# Patient Record
Sex: Male | Born: 1965 | Race: Black or African American | Hispanic: No | Marital: Married | Smoking: Never smoker
Health system: Southern US, Community
[De-identification: ages and names within clinical notes are randomized; demographics above are authoritative.]

## PROBLEM LIST (undated history)

## (undated) DIAGNOSIS — I10 Essential (primary) hypertension: Secondary | ICD-10-CM

---

## 2005-10-12 ENCOUNTER — Emergency Department (HOSPITAL_COMMUNITY): Admission: EM | Admit: 2005-10-12 | Discharge: 2005-10-13 | Payer: Self-pay | Admitting: Emergency Medicine

## 2012-05-23 ENCOUNTER — Emergency Department (HOSPITAL_COMMUNITY): Payer: Worker's Compensation

## 2012-05-23 ENCOUNTER — Emergency Department (HOSPITAL_COMMUNITY)
Admission: EM | Admit: 2012-05-23 | Discharge: 2012-05-23 | Disposition: A | Discharge status: Home / Self Care | Payer: Worker's Compensation | Attending: Emergency Medicine | Admitting: Emergency Medicine

## 2012-05-23 ENCOUNTER — Encounter (HOSPITAL_COMMUNITY): Payer: Self-pay | Admitting: Emergency Medicine

## 2012-05-23 DIAGNOSIS — Y9389 Activity, other specified: Secondary | ICD-10-CM | POA: Insufficient documentation

## 2012-05-23 DIAGNOSIS — S0990XA Unspecified injury of head, initial encounter: Secondary | ICD-10-CM | POA: Insufficient documentation

## 2012-05-23 DIAGNOSIS — S139XXA Sprain of joints and ligaments of unspecified parts of neck, initial encounter: Secondary | ICD-10-CM | POA: Insufficient documentation

## 2012-05-23 DIAGNOSIS — I10 Essential (primary) hypertension: Secondary | ICD-10-CM | POA: Insufficient documentation

## 2012-05-23 DIAGNOSIS — Z79899 Other long term (current) drug therapy: Secondary | ICD-10-CM | POA: Insufficient documentation

## 2012-05-23 DIAGNOSIS — S161XXA Strain of muscle, fascia and tendon at neck level, initial encounter: Secondary | ICD-10-CM

## 2012-05-23 HISTORY — DX: Essential (primary) hypertension: I10

## 2012-05-23 MED ORDER — IBUPROFEN 800 MG PO TABS
800.0000 mg | ORAL_TABLET | Freq: Once | ORAL | Status: AC
  Administered 2012-05-23: 800 mg via ORAL
  Filled 2012-05-23: qty 1

## 2012-05-23 MED ORDER — IBUPROFEN 800 MG PO TABS: 800.0000 mg | ORAL_TABLET | Freq: Three times a day (TID) | ORAL | Status: DC

## 2012-05-23 MED ORDER — CYCLOBENZAPRINE HCL 10 MG PO TABS: 10.0000 mg | ORAL_TABLET | Freq: Three times a day (TID) | ORAL | Status: DC | PRN

## 2012-05-23 NOTE — ED Notes (Signed)
Back board removed, no complaints of back pain.

## 2012-05-23 NOTE — ED Notes (Signed)
Pt discharged to home with family. NAD.  

## 2012-05-23 NOTE — ED Notes (Signed)
Pt to xray
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
## 2013-04-08 ENCOUNTER — Encounter: Payer: Self-pay | Admitting: Neurology

## 2013-04-09 ENCOUNTER — Ambulatory Visit: Payer: 59 | Admitting: Neurology

## 2013-04-09 ENCOUNTER — Telehealth: Payer: Self-pay | Admitting: Neurology

## 2013-04-09 NOTE — Telephone Encounter (Signed)
The patient did not show for the appointment today. 

## 2019-08-14 ENCOUNTER — Other Ambulatory Visit: Payer: Self-pay | Admitting: Student

## 2019-08-14 ENCOUNTER — Telehealth: Payer: Self-pay | Admitting: Nurse Practitioner

## 2019-08-14 DIAGNOSIS — M546 Pain in thoracic spine: Secondary | ICD-10-CM

## 2019-08-14 NOTE — Telephone Encounter (Signed)
Phone call to patient to verify medication list and allergies for myelogram procedure. Pt instructed to hold Cymbalta for 48hrs prior to myelogram appointment time. Pt verbalized understanding. Pre and post procedure instructions reviewed with pt. 

## 2019-08-18 ENCOUNTER — Other Ambulatory Visit (HOSPITAL_COMMUNITY): Payer: Self-pay | Admitting: Student

## 2019-08-18 DIAGNOSIS — M546 Pain in thoracic spine: Secondary | ICD-10-CM

## 2019-08-20 ENCOUNTER — Emergency Department (HOSPITAL_COMMUNITY)
Admission: EM | Admit: 2019-08-20 | Discharge: 2019-08-20 | Disposition: A | Discharge status: Home / Self Care | Payer: Self-pay | Attending: Emergency Medicine | Admitting: Emergency Medicine

## 2019-08-20 ENCOUNTER — Other Ambulatory Visit: Payer: Self-pay

## 2019-08-20 ENCOUNTER — Emergency Department (HOSPITAL_COMMUNITY): Payer: Self-pay

## 2019-08-20 DIAGNOSIS — R7989 Other specified abnormal findings of blood chemistry: Secondary | ICD-10-CM | POA: Insufficient documentation

## 2019-08-20 DIAGNOSIS — M546 Pain in thoracic spine: Secondary | ICD-10-CM | POA: Insufficient documentation

## 2019-08-20 DIAGNOSIS — I10 Essential (primary) hypertension: Secondary | ICD-10-CM | POA: Insufficient documentation

## 2019-08-20 DIAGNOSIS — M549 Dorsalgia, unspecified: Secondary | ICD-10-CM

## 2019-08-20 DIAGNOSIS — Z79899 Other long term (current) drug therapy: Secondary | ICD-10-CM | POA: Insufficient documentation

## 2019-08-20 LAB — BASIC METABOLIC PANEL
Anion gap: 12 (ref 5–15)
BUN: 13 mg/dL (ref 6–20)
CO2: 22 mmol/L (ref 22–32)
Calcium: 9.2 mg/dL (ref 8.9–10.3)
Chloride: 107 mmol/L (ref 98–111)
Creatinine, Ser: 1.66 mg/dL — ABNORMAL HIGH (ref 0.61–1.24)
GFR calc Af Amer: 54 mL/min — ABNORMAL LOW (ref >60)
GFR calc non Af Amer: 46 mL/min — ABNORMAL LOW (ref >60)
Glucose, Bld: 102 mg/dL — ABNORMAL HIGH (ref 70–99)
Potassium: 5.1 mmol/L (ref 3.5–5.1)
Sodium: 141 mmol/L (ref 135–145)

## 2019-08-20 MED ORDER — LIDOCAINE 5 % EX PTCH: 1.0000 | MEDICATED_PATCH | CUTANEOUS | 0 refills | Status: AC

## 2019-08-20 MED ORDER — METHOCARBAMOL 500 MG PO TABS: 500.0000 mg | ORAL_TABLET | Freq: Two times a day (BID) | ORAL | 0 refills | Status: AC

## 2019-08-20 MED ORDER — LIDOCAINE 5 % EX PTCH
1.0000 | MEDICATED_PATCH | Freq: Once | CUTANEOUS | Status: DC
  Administered 2019-08-20: 1 via TRANSDERMAL
  Filled 2019-08-20: qty 1

## 2019-08-20 MED ORDER — KETOROLAC TROMETHAMINE 60 MG/2ML IM SOLN
30.0000 mg | Freq: Once | INTRAMUSCULAR | Status: AC
  Administered 2019-08-20: 30 mg via INTRAMUSCULAR
  Filled 2019-08-20: qty 2

## 2019-08-20 MED ORDER — NAPROXEN 500 MG PO TABS: 500.0000 mg | ORAL_TABLET | Freq: Two times a day (BID) | ORAL | 0 refills | Status: AC

## 2019-08-20 NOTE — ED Triage Notes (Signed)
Pt here after falling off the back of a chair at the [facility]. Some chairs have backs and others don't and patient thought this one had a back. Pt has mid/lower back, which is chronic from arthritis, but this has made it worse. No LOC, no blood thinners.

## 2019-08-20 NOTE — Discharge Instructions (Addendum)
Your xray did not show any broken bones today. Your pain may be musculoskeletal in nature.   Please pick up medication and take as prescribed. DO NOT DRIVE WHILE ON THE MUSCLE RELAXER AS IT CAN MAKE YOU DROWSY.   Please follow up with your PCP for further evaluation. Your kidney function was mildly elevated today but we do not have old labs to compare to. Please touch base with your PCP regarding your kidney levels. Increase your water intake until you can see them again.   Return to the ED for any worsening symptoms including worsening pain, tingling to your arms or legs, weakness to your arms or legs, or any other concerning symptoms

## 2019-08-20 NOTE — ED Notes (Signed)
Patient verbalizes understanding of discharge instructions. Opportunity for questioning and answers were provided. Armband removed by staff, pt discharged from ED ambulatory.   

## 2019-08-20 NOTE — ED Provider Notes (Signed)
[facility] EMERGENCY DEPARTMENT Provider Note   CSN: [id number] Arrival date & time: 08/20/19  1406     History Chief Complaint  Patient presents with  . Back Pain    Joel Whitehead is a 54 y.o. male with a PMHx of HTN on lisinopril who presents to the ED today complaining of gradual onset, constant, achy, mid back pain s/p fall out of chair that occurred earlier today.  She reports he was at the [facility] when he sat back in a chair that he believed to have a back.  Patient attempted to relax in the chair however fell backwards and landed onto his stomach.  He states he felt a little bit dazed on impact however denies head injury or loss of consciousness.  He states that he has been having back pain since then.  Patient does endorse history of arthritis in his back for which he takes Mobic.  Is not taking anything prior to arrival in the ED.  Denies paresthesias in upper or lower extremities, headache, nausea, vomiting, vision changes, unilateral weakness or numbness, any other associated symptoms.  The history is provided by the patient and medical records.       Past Medical History:  Diagnosis Date  . Hypertension     There are no problems to display for this patient.   No past surgical history on file.     Family History  Problem Relation Age of Onset  . Coronary artery disease Mother     Social History   Tobacco Use  . Smoking status: Never Smoker  . Smokeless tobacco: Never Used  Substance Use Topics  . Alcohol use: No  . Drug use: No    Home Medications Prior to Admission medications   Medication Sig Start Date End Date Taking? Authorizing Provider  cyclobenzaprine (FLEXERIL) 10 MG tablet Take 1 tablet (10 mg total) by mouth 3 (three) times daily as needed for muscle spasms. Patient not taking: Reported on 08/14/2019 05/23/12   Pollyann Savoy, MD  DULoxetine (CYMBALTA) 60 MG capsule Take 60 mg by mouth daily.    [provider]   ibuprofen (ADVIL,MOTRIN) 800 MG tablet Take 1 tablet (800 mg total) by mouth 3 (three) times daily. Patient not taking: Reported on 08/14/2019 05/23/12   Pollyann Savoy, MD  lidocaine (LIDODERM) 5 % Place 1 patch onto the skin daily. Remove & Discard patch within 12 hours or as directed by MD 08/20/19   Tanda Rockers, PA-C  meloxicam (MOBIC) 15 MG tablet Take 15 mg by mouth daily.    [provider]  methocarbamol (ROBAXIN) 500 MG tablet Take 1 tablet (500 mg total) by mouth 2 (two) times daily. 08/20/19   Hyman Hopes, Devita Nies, PA-C  naproxen (NAPROSYN) 500 MG tablet Take 1 tablet (500 mg total) by mouth 2 (two) times daily. 08/20/19   Hyman Hopes, Jaline Pincock, PA-C  ranitidine (ZANTAC) 150 MG tablet Take 150 mg by mouth daily.    [provider]    Allergies    Patient has no known allergies.  Review of Systems   Review of Systems  Constitutional: Negative for chills and fever.  Musculoskeletal: Positive for back pain.  Neurological: Negative for syncope, weakness, numbness and headaches.    Physical Exam Updated Vital Signs BP (!) 144/81   Pulse 91   Temp 98.2 F (36.8 C) (Oral)   Resp 18   SpO2 100%   Physical Exam Vitals and nursing note reviewed.  Constitutional:  Appearance: He is not ill-appearing or diaphoretic.  HENT:     Head: Normocephalic and atraumatic.  Eyes:     Conjunctiva/sclera: Conjunctivae normal.  Cardiovascular:     Rate and Rhythm: Normal rate and regular rhythm.     Pulses: Normal pulses.  Pulmonary:     Effort: Pulmonary effort is normal.     Breath sounds: Normal breath sounds. No wheezing, rhonchi or rales.  Musculoskeletal:       Back:     Comments: No C or L midline spinal TTP. + midline thoracic spinal tenderness to palpation with paraspinal muscular TTP. ROM intact throughout neck and back. Strength equal to BUE and BLEs. Sensation intact throughout. Good distal pulses.   Skin:    General: Skin is warm and dry.     Coloration:  Skin is not jaundiced.  Neurological:     Mental Status: He is alert.     ED Results / Procedures / Treatments   Labs (all labs ordered are listed, but only abnormal results are displayed) Labs Reviewed  BASIC METABOLIC PANEL - Abnormal; Notable for the following components:      Result Value   Glucose, Bld 102 (*)    Creatinine, Ser 1.66 (*)    GFR calc non Af Amer 46 (*)    GFR calc Af Amer 54 (*)    All other components within normal limits    EKG None  Radiology DG Thoracic Spine W/Swimmers  Result Date: 08/20/2019 CLINICAL DATA:  Larey Seat backwards out of a chair. EXAM: THORACIC SPINE - 3 VIEWS COMPARISON:  None. FINDINGS: Mild scoliotic curvature convex to the right in the upper thoracic region and to the left in the lower thoracic region. Increased kyphotic curvature. No evidence of vertebral body fracture. No advanced degenerative disc disease. Posteromedial ribs appear negative. IMPRESSION: No acute or traumatic finding. Mild scoliotic and kyphotic curvature. Electronically Signed   By: Paulina Fusi M.D.   On: 08/20/2019 17:11    Procedures Procedures (including critical care time)  Medications Ordered in ED Medications  lidocaine (LIDODERM) 5 % 1 patch (1 patch Transdermal Patch Applied 08/20/19 1632)  ketorolac (TORADOL) injection 30 mg (30 mg Intramuscular Given 08/20/19 1809)    ED Course  I have reviewed the triage vital signs and the nursing notes.  Pertinent labs & imaging results that were available during my care of the patient were reviewed by me and considered in my medical decision making (see chart for details).  Clinical Course as of Aug 19 1821  Thu Aug 20, 2019  1813 BP(!): 144/81 [MV]    Clinical Course User Index [MV] Tanda Rockers, [place]   54 year old male who presents to the ED today with complaint of mid back pain after falling out of a chair earlier today.  History arthritis.  Did not take anything prior to arrival.  No head injury or loss of  consciousness.  Rival to the ED patient is afebrile, nontachycardic and nontachypneic.  Blood pressure elevated however suspect this is more due to pain.  Patient states he did take his blood pressure medication this morning.  Has midline T-spine tenderness on exam however no step-offs or deformities.  Given mechanism of injury I have lower suspicion for any kind of fracture however will obtain x-ray at this time.  I do not feel patient needs a CT scan given mechanism of injury.  Will apply lidocaine patch in the ED and obtain NP to check creatinine level.  Patient does not  have any lab work in our system.  Hold off on Toradol injection until creatinine level is checked.  Will recheck blood pressure after pain control.  Xray negative at this time. BMP with creatine 1.66. Do not have baseline to compare to. Will give lesser dose of toradol at this time. Pt advised to follow up with his PCP at the [facility] regarding his kidney function. Encouraged to increase oral intake until then.   Repeat blood pressure 144/81 after toradol.   Feel pt stable for discharge at this time. Strict return precautions discussed. Pt discharged with muscle relaxer and antiinflammatories.   This note was prepared using Dragon voice recognition software and may include unintentional dictation errors due to the inherent limitations of voice recognition software.  MDM Rules/Calculators/A&P                       Final Clinical Impression(s) / ED Diagnoses Final diagnoses:  Acute midline thoracic back pain  Elevated serum creatinine    Rx / DC Orders ED Discharge Orders         Ordered    methocarbamol (ROBAXIN) 500 MG tablet  2 times daily     08/20/19 1814    naproxen (NAPROSYN) 500 MG tablet  2 times daily     08/20/19 1814    lidocaine (LIDODERM) 5 %  Every 24 hours     08/20/19 1822           Discharge Instructions     Your xray did not show any broken bones today. Your pain may be musculoskeletal in nature.    Please pick up medication and take as prescribed. DO NOT DRIVE WHILE ON THE MUSCLE RELAXER AS IT CAN MAKE YOU DROWSY.   Please follow up with your PCP for further evaluation. Your kidney function was mildly elevated today but we do not have old labs to compare to. Please touch base with your PCP regarding your kidney levels. Increase your water intake until you can see them again.   Return to the ED for any worsening symptoms including worsening pain, tingling to your arms or legs, weakness to your arms or legs, or any other concerning symptoms       Eustaquio Maize, PA-C 08/20/19 1823    Veryl Speak, MD 08/20/19 2248

## 2019-08-25 ENCOUNTER — Ambulatory Visit (HOSPITAL_COMMUNITY): Admission: RE | Admit: 2019-08-25 | Payer: Self-pay | Source: Ambulatory Visit

## 2019-08-25 ENCOUNTER — Other Ambulatory Visit (HOSPITAL_COMMUNITY): Payer: Self-pay

## 2019-08-26 ENCOUNTER — Other Ambulatory Visit (HOSPITAL_COMMUNITY): Payer: Self-pay

## 2019-08-26 ENCOUNTER — Other Ambulatory Visit: Payer: Self-pay

## 2019-08-26 ENCOUNTER — Ambulatory Visit (HOSPITAL_COMMUNITY): Admission: RE | Admit: 2019-08-26 | Payer: Self-pay | Source: Ambulatory Visit

## 2019-08-26 ENCOUNTER — Ambulatory Visit (HOSPITAL_COMMUNITY)
Admission: RE | Admit: 2019-08-26 | Discharge: 2019-08-26 | Disposition: A | Discharge status: Home / Self Care | Payer: No Typology Code available for payment source | Source: Ambulatory Visit | Attending: Student | Admitting: Student

## 2019-08-26 DIAGNOSIS — G8929 Other chronic pain: Secondary | ICD-10-CM | POA: Diagnosis not present

## 2019-08-26 DIAGNOSIS — R937 Abnormal findings on diagnostic imaging of other parts of musculoskeletal system: Secondary | ICD-10-CM | POA: Insufficient documentation

## 2019-08-26 DIAGNOSIS — M546 Pain in thoracic spine: Secondary | ICD-10-CM | POA: Diagnosis not present

## 2019-08-26 DIAGNOSIS — M47814 Spondylosis without myelopathy or radiculopathy, thoracic region: Secondary | ICD-10-CM | POA: Diagnosis not present

## 2019-08-26 MED ORDER — DIAZEPAM 5 MG PO TABS
5.0000 mg | ORAL_TABLET | Freq: Once | ORAL | Status: AC
  Administered 2019-08-26: 5 mg via ORAL

## 2019-08-26 MED ORDER — LIDOCAINE HCL (PF) 1 % IJ SOLN
5.0000 mL | Freq: Once | INTRAMUSCULAR | Status: AC
  Administered 2019-08-26: 5 mL via INTRADERMAL

## 2019-08-26 MED ORDER — IOHEXOL 300 MG/ML  SOLN
10.0000 mL | Freq: Once | INTRAMUSCULAR | Status: AC | PRN
  Administered 2019-08-26: 10:00:00 10 mL via INTRATHECAL

## 2019-08-26 MED ORDER — DIAZEPAM 5 MG PO TABS
ORAL_TABLET | ORAL | Status: AC
  Filled 2019-08-26: qty 1

## 2019-08-26 MED ORDER — OXYCODONE-ACETAMINOPHEN 5-325 MG PO TABS: 1.0000 | ORAL_TABLET | ORAL | Status: DC | PRN

## 2019-08-26 NOTE — Discharge Instructions (Signed)
Myelogram  A myelogram is an imaging study of the spinal cord and the places where nerves attach to the spinal cord (nerve roots). A dye (contrast material) is injected into the spine before the X-ray. This provides a clearer image for your health care provider to see. You may need this study done if you have a spinal cord problem that cannot be diagnosed with other imaging studies, such as a CT scan or an MRI. You may also have this study to check your spine after surgery. Tell a health care provider about:  Any allergies you have, especially to iodine.  All medicines you are taking, including vitamins, herbs, eye drops, creams, and over-the-counter medicines.  Any problems you or family members have had with anesthetic medicines or contrast material.  Any blood disorders you have.  Any surgeries you have had.  Any medical conditions you have or have had, including asthma.  Whether you are pregnant or may be pregnant. What are the risks? Generally, this is a safe procedure. However, problems may occur, including:  Infection.  Bleeding.  Allergic reaction to medicines or dyes.  Damage to your spinal cord or nerves.  Loss or leaking of spinal fluid. This can lead to headaches.  Damage to kidneys.  Seizures. This is rare. What happens before the procedure?  Follow instructions from your health care provider about eating or drinking restrictions. You may be asked to drink more fluids.  Ask your health care provider about changing or stopping your regular medicines. This is especially important if you are taking diabetes medicines or blood thinners.  Plan to have someone take you home from the hospital or clinic.  If you will be going home right after the procedure, plan to have someone with you for 24 hours. What happens during the procedure?  You will lie face down on a table.  Your health care provider will locate the best injection site on your spine. This is most  often in the lower back.  The area of injection will be washed with soap.  You will be given a medicine to numb the area (local anesthetic).  Your health care provider will insert a long needle into the space around your spinal cord (subarachnoid space).  A sample of spinal fluid may be taken and sent to the lab for testing.  The contrast material will be injected into the subarachnoid space.  The exam table may be tilted to help the contrast material flow up or down your spine.  The X-ray will take images of your spinal cord for your health care provider to examine.  A bandage (dressing) may be placed over the injection site. The procedure may vary among health care providers and hospitals. What can I expect after this procedure?  Your blood pressure, heart rate, breathing rate, and blood oxygen level may be monitored until you leave the hospital or clinic.  You may have: ? Soreness on your injection site. ? A mild headache.  You will be asked to lie down with your head raised (elevated). This reduces the risk of a headache.  It is up to you to get the results of your procedure. Ask your health care provider, or the department that is doing the procedure, when your results will be ready. Follow these instructions at home:   Rest as told by your health care provider. Lie flat with your head slightly elevated to reduce the risk of a headache.  Do not bend, lift, or do hard work   for 24-48 hours, or as told by your health care provider.  Take over-the-counter and prescription medicines only as told by your health care provider.  Take care of your dressing as told by your health care provider.  Drink enough fluid to keep your urine pale yellow.  Bathe or shower as told by your health care provider. Contact a health care provider if:  You have a fever.  You have a headache that lasts longer than 24 hours.  You feel nauseous or vomit.  You have a stiff neck or numbness in  your legs.  You are unable to urinate or have a bowel movement.  You develop a rash, itching, or sneezing. Get help right away if:  You have new symptoms or your symptoms get worse.  You have a seizure.  You have trouble breathing. Summary  A myelogram is an imaging study of the spinal cord and the places where nerves attach to the spinal cord (nerve roots).  Before the procedure, follow instructions from your health care provider about changing or stopping your regular medicines, and eating and drinking restrictions.  After this procedure, you will be asked to lie down with your head raised (elevated). This reduces your risk of a headache.  Do not bend, lift, or do hard work for 24-48 hours, or as told by your health care provider.  Contact a health care provider if you have a stiff neck or numbness in your legs. Get help right away if symptoms get worse, or you have a seizure or trouble breathing. This information is not intended to replace advice given to you by your health care provider. Make sure you discuss any questions you have with your health care provider. Document Revised: 08/13/2018 Document Reviewed: 08/14/2018 Elsevier Patient Education  2020 [employer]  

## 2019-08-26 NOTE — Procedures (Signed)
Uncomplicated L4/5 LP and thoracic myelography, see full details in imaging folder.  JWatts MD

## 2019-08-26 NOTE — Progress Notes (Signed)
Dr. Grace Isaac present during VS collection and is aware of pt's BP.  Pt reports pain a 9 out of 10.

## 2020-11-09 IMAGING — RF DG MYELOGRAPHY LUMBAR INJ THORACIC
10 series · 11 of 11 positions shown · non-contrast
Comparison: none

CLINICAL DATA: Thoracic back pain extending to the chest,
especially when lifting. Outside thoracic MRI reporting some
abnormality at T7
TECHNIQUE: Contiguous axial images were obtained through the Thoracic spine
after the intrathecal infusion of infusion. Coronal and sagittal
reconstructions were obtained of the axial image sets.

[Series 1: cp_standard · 0.25mm/px · 1 of 1 slices shown (1 of 4)]
[im 1/1]
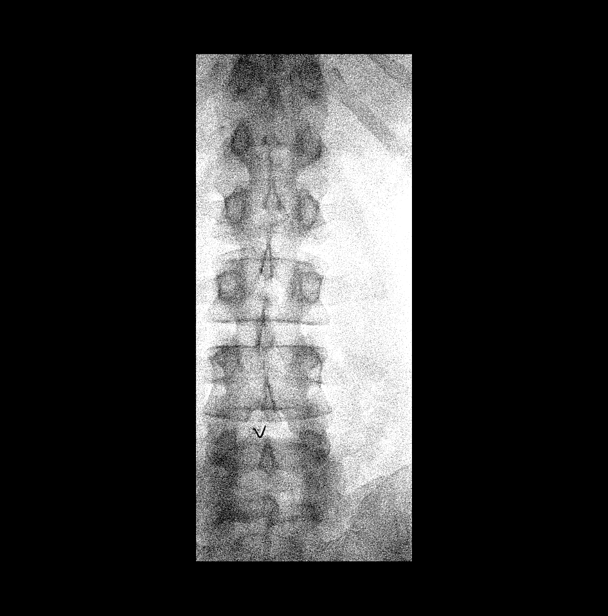

[Series 3: cp_standard · 0.25mm/px · 2 of 2 slices shown (2 of 4)]
[im 1/2]
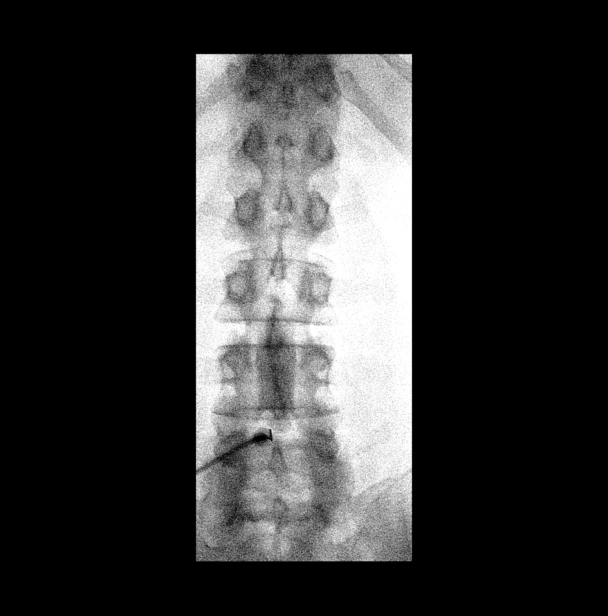
[im 2/2]
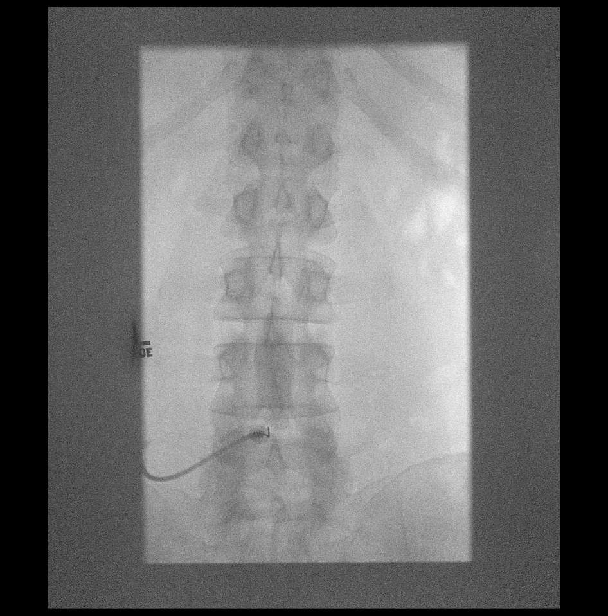

[Series 4: cp_standard · 0.25mm/px · 1 of 1 slices shown (3 of 4)]
[im 1/1]
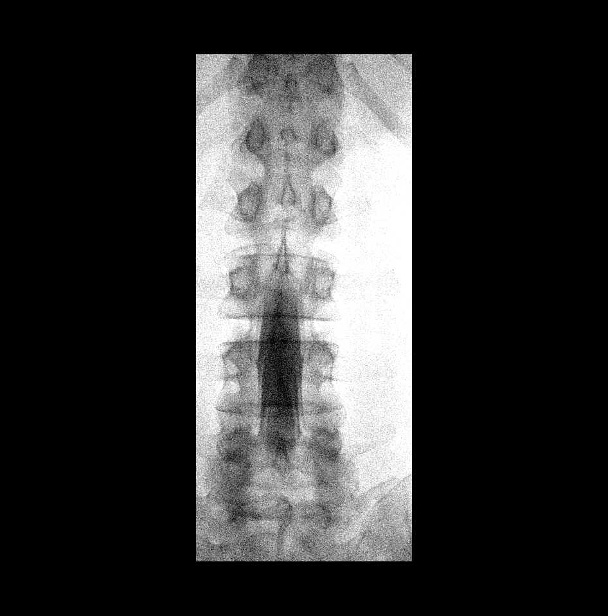

[Series 5: cp_standard · 0.25mm/px · 1 of 1 slices shown (4 of 4)]
[im 1/1]
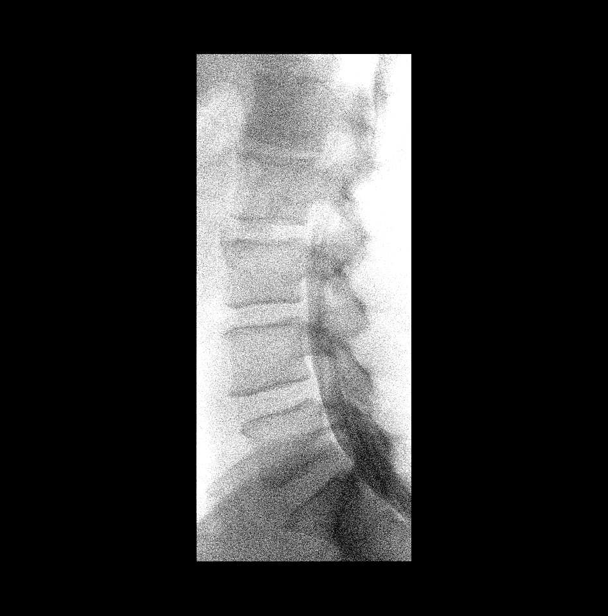

[Series 6: fluoro_myelogram_singleshot_bw · 0.17mm/px · 1 of 1 slices shown (1 of 6)]
[im 1/1]
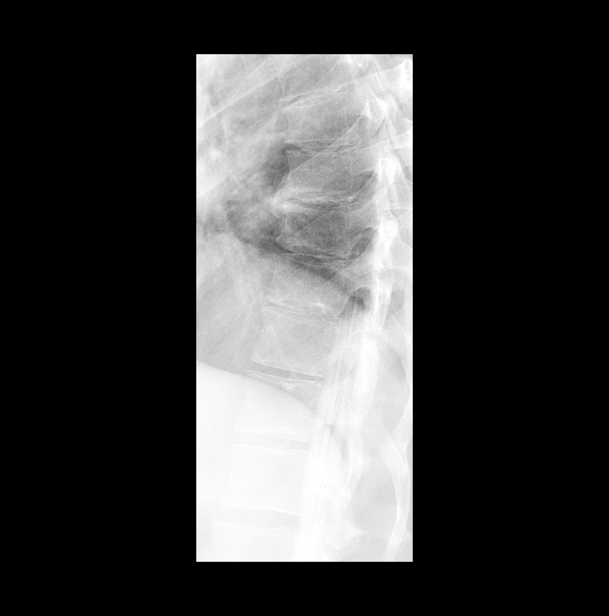

[Series 7: fluoro_myelogram_singleshot_bw · 0.17mm/px · 1 of 1 slices shown (2 of 6)]
[im 1/1]
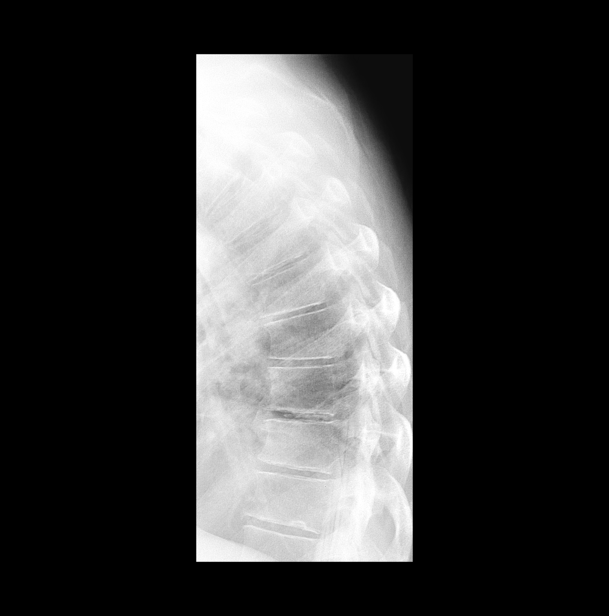

[Series 8: fluoro_myelogram_singleshot_bw · 0.17mm/px · 1 of 1 slices shown (3 of 6)]
[im 1/1]
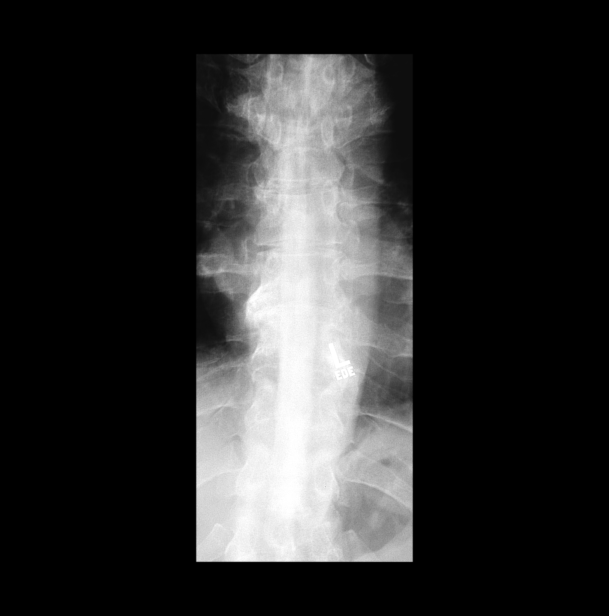

[Series 9: fluoro_myelogram_singleshot_bw · 0.17mm/px · 1 of 1 slices shown (4 of 6)]
[im 1/1]
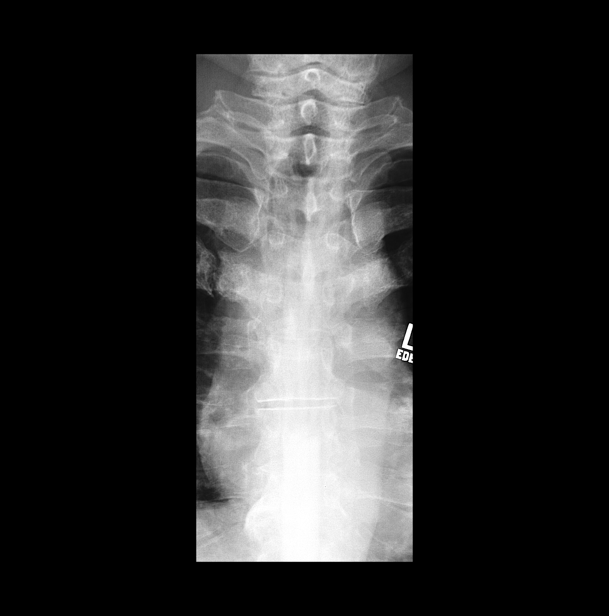

[Series 10: fluoro_myelogram_singleshot_bw · 0.17mm/px · 1 of 1 slices shown (5 of 6)]
[im 1/1]
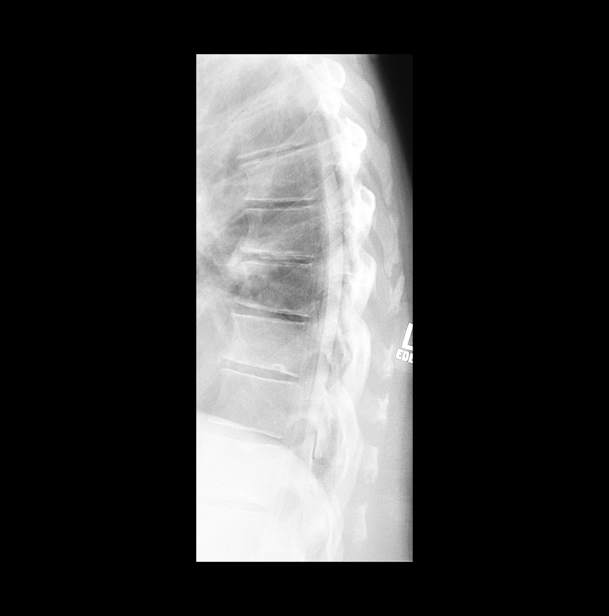

[Series 11: fluoro_myelogram_singleshot_bw · 0.17mm/px · 1 of 1 slices shown (6 of 6)]
[im 1/1]
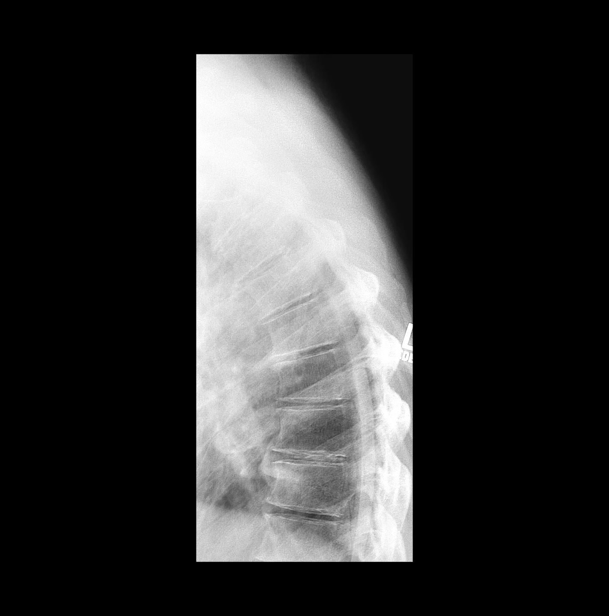

[11 of 11 positions shown; findings below may reference images not displayed]

FLUOROSCOPY TIME:  dictate in minutes and seconds

PROCEDURE:
LUMBAR PUNCTURE FOR THORACIC MYELOGRAM

After thorough discussion of risks and benefits of the procedure
written and oral informed consent was obtained. The patient will let
us know if he develops a persistent positional headache. Consent was
obtained by Dr. Rinnah Tiger.

Patient was positioned prone on the fluoroscopy table. Local
anesthesia was provided with 1% lidocaine without epinephrine after
prepped and draped in the usual sterile fashion. Puncture was
performed at L4-5 using a 5 inch 22-gauge spinal needle via
paramedian approach. Using a single pass through the dura, the
needle was placed within the thecal sac, with return of clear CSF.
10 mL of Isovue A-UWW was injected into the thecal sac, with normal
opacification of the nerve roots and cauda equina consistent with
free flow within the subarachnoid space. The patient was then
position such that contrast flowed into the Thoracic spine region.

I personally performed the lumbar puncture and administered the
intrathecal contrast. I also personally supervised acquisition of
the myelogram images.
FINDINGS: THORACIC MYELOGRAM FINDINGS:

Free flow of intrathecal contrast. The thoracic cord was
preferentially positioned in the ventral canal and a slight dorsal
deformity is seen at the level of T7.

CT THORACIC MYELOGRAM FINDINGS:

Alignment: Exaggerated thoracic kyphosis without listhesis.

Vertebrae: No fracture, endplate erosion, or destructive bone
lesion. There is disc calcification at T8-9, T11-12, and T12-L1 from
nonspecific remote insult.

Cord: Free flow of intrathecal contrast without intraspinal
collection. No nodularity seen along the nerve roots. On sagittal
images there is mild dorsal flattening of the cord at the level of
T7-8. No underlying herniation is seen. Normal cord volume.

Extra-spinal: Small bilateral renal calculi.  Cholecystectomy clips.

Disc levels: T8-9 notable spondylitic spurring which is bridging.
Spondylitic spurring elsewhere is mild. No noted dorsal endplate
spurring at the level of cord deformity. No significant facet
degeneration. Diffusely patent foramina.
IMPRESSION: 1. Subtle but abnormal dorsal cord deformity at the level of T7-8,
compatible with dorsal web. No intrathecal collection.
2. Exaggerated kyphosis with spondylosis.
